# Patient Record
Sex: Female | Born: 1990 | Race: White | Hispanic: No | Marital: Single | Smoking: Never smoker
Health system: Southern US, Community
[De-identification: ages and names within clinical notes are randomized; demographics above are authoritative.]

## PROBLEM LIST (undated history)

## (undated) DIAGNOSIS — Z789 Other specified health status: Secondary | ICD-10-CM

## (undated) HISTORY — DX: Other specified health status: Z78.9

---
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
## 2013-04-17 ENCOUNTER — Other Ambulatory Visit (HOSPITAL_COMMUNITY): Payer: Self-pay | Admitting: Pulmonary Disease

## 2013-04-17 ENCOUNTER — Ambulatory Visit (HOSPITAL_COMMUNITY)
Admission: RE | Admit: 2013-04-17 | Discharge: 2013-04-17 | Disposition: A | Discharge status: Home / Self Care | Payer: BC Managed Care – PPO | Source: Ambulatory Visit | Attending: Pulmonary Disease | Admitting: Pulmonary Disease

## 2013-04-17 DIAGNOSIS — J029 Acute pharyngitis, unspecified: Secondary | ICD-10-CM | POA: Insufficient documentation

## 2013-04-17 DIAGNOSIS — R05 Cough: Secondary | ICD-10-CM

## 2013-04-17 DIAGNOSIS — R0989 Other specified symptoms and signs involving the circulatory and respiratory systems: Secondary | ICD-10-CM | POA: Insufficient documentation

## 2013-04-17 DIAGNOSIS — R059 Cough, unspecified: Secondary | ICD-10-CM | POA: Insufficient documentation

## 2013-04-30 ENCOUNTER — Other Ambulatory Visit (HOSPITAL_COMMUNITY): Payer: Self-pay | Admitting: Pulmonary Disease

## 2013-04-30 ENCOUNTER — Ambulatory Visit (HOSPITAL_COMMUNITY)
Admission: RE | Admit: 2013-04-30 | Discharge: 2013-04-30 | Disposition: A | Discharge status: Home / Self Care | Payer: BC Managed Care – PPO | Source: Ambulatory Visit | Attending: Pulmonary Disease | Admitting: Pulmonary Disease

## 2013-04-30 DIAGNOSIS — R52 Pain, unspecified: Secondary | ICD-10-CM

## 2013-04-30 DIAGNOSIS — R079 Chest pain, unspecified: Secondary | ICD-10-CM | POA: Insufficient documentation

## 2019-01-23 DIAGNOSIS — J029 Acute pharyngitis, unspecified: Secondary | ICD-10-CM | POA: Diagnosis not present

## 2019-01-23 DIAGNOSIS — J111 Influenza due to unidentified influenza virus with other respiratory manifestations: Secondary | ICD-10-CM | POA: Diagnosis not present

## 2019-01-23 DIAGNOSIS — R509 Fever, unspecified: Secondary | ICD-10-CM | POA: Diagnosis not present

## 2019-01-23 DIAGNOSIS — R000 Tachycardia, unspecified: Secondary | ICD-10-CM | POA: Diagnosis not present

## 2019-02-20 ENCOUNTER — Ambulatory Visit (HOSPITAL_COMMUNITY)
Admission: RE | Admit: 2019-02-20 | Discharge: 2019-02-20 | Disposition: A | Discharge status: Home / Self Care | Payer: BLUE CROSS/BLUE SHIELD | Source: Ambulatory Visit | Attending: Pulmonary Disease | Admitting: Pulmonary Disease

## 2019-02-20 ENCOUNTER — Other Ambulatory Visit (HOSPITAL_COMMUNITY): Payer: Self-pay | Admitting: Pulmonary Disease

## 2019-02-20 DIAGNOSIS — N200 Calculus of kidney: Secondary | ICD-10-CM

## 2019-02-21 ENCOUNTER — Encounter (HOSPITAL_COMMUNITY): Payer: Self-pay

## 2019-02-21 ENCOUNTER — Ambulatory Visit (HOSPITAL_COMMUNITY)
Admission: EM | Admit: 2019-02-21 | Discharge: 2019-02-21 | Disposition: A | Discharge status: Home / Self Care | Payer: BLUE CROSS/BLUE SHIELD | Attending: Family Medicine | Admitting: Family Medicine

## 2019-02-21 DIAGNOSIS — S39012A Strain of muscle, fascia and tendon of lower back, initial encounter: Secondary | ICD-10-CM | POA: Diagnosis not present

## 2019-02-21 DIAGNOSIS — S46812A Strain of other muscles, fascia and tendons at shoulder and upper arm level, left arm, initial encounter: Secondary | ICD-10-CM | POA: Diagnosis not present

## 2019-02-21 LAB — GLUCOSE, CAPILLARY: GLUCOSE-CAPILLARY: 96 mg/dL (ref 70–99)

## 2019-02-21 MED ORDER — NAPROXEN 375 MG PO TABS: 375.0000 mg | ORAL_TABLET | Freq: Two times a day (BID) | ORAL | 0 refills | Status: DC

## 2019-02-21 MED ORDER — CYCLOBENZAPRINE HCL 5 MG PO TABS: 5.0000 mg | ORAL_TABLET | Freq: Every day | ORAL | 0 refills | Status: DC

## 2019-02-21 NOTE — ED Provider Notes (Signed)
[place]   [id number] 02/21/19 Arrival Time: 6144  CC: back pain  SUBJECTIVE: History from: patient. Carol Soto is a 28 y.o. female complains of left low back pain that began 1 months ago, with worsening symptoms in the past 3-4 days.  Denies a precipitating event or specific injury.  Does mention having horses, and lifting hay.  Localizes the pain to the left low back.  Describes the pain as worsening, and more constant.  Was seen by PCP for similar symptoms and had a CT of her abdomen.  PCP concerned for kidney stones.  CT was negative.  Symptoms are made worse with laying down.  Denies similar symptoms in the past.  Does mentions associated left arm and foot tingling, as well as increased thirst.  Denies fever, chills, nausea, vomiting erythema, ecchymosis, effusion, weakness, abdominal pain, changes in bowel or bladder habits, numbness.    ROS: As per HPI.  History reviewed. No pertinent past medical history. History reviewed. No pertinent surgical history. Allergies  Allergen Reactions  . Amoxicillin    No current facility-administered medications on file prior to encounter.    No current outpatient medications on file prior to encounter.   Social History   Socioeconomic History  . Marital status: Single    Spouse name: Not on file  . Number of children: Not on file  . Years of education: Not on file  . Highest education level: Not on file  Occupational History  . Not on file  Social Needs  . Financial resource strain: Not on file  . Food insecurity:    Worry: Not on file    Inability: Not on file  . Transportation needs:    Medical: Not on file    Non-medical: Not on file  Tobacco Use  . Smoking status: Never Smoker  . Smokeless tobacco: Never Used  Substance and Sexual Activity  . Alcohol use: Not on file  . Drug use: Not on file  . Sexual activity: Not on file  Lifestyle  . Physical activity:    Days per week: Not on file    Minutes per  session: Not on file  . Stress: Not on file  Relationships  . Social connections:    Talks on phone: Not on file    Gets together: Not on file    Attends religious service: Not on file    Active member of club or organization: Not on file    Attends meetings of clubs or organizations: Not on file    Relationship status: Not on file  . Intimate partner violence:    Fear of current or ex partner: Not on file    Emotionally abused: Not on file    Physically abused: Not on file    Forced sexual activity: Not on file  Other Topics Concern  . Not on file  Social History Narrative  . Not on file   Family History  Problem Relation Age of Onset  . Healthy Mother   . Healthy Father     OBJECTIVE:  Vitals:   02/21/19 1708  BP: 129/89  Pulse: (!) 102  Resp: 20  Temp: 98.3 F (36.8 C)  TempSrc: Oral  SpO2: 98%    General appearance: Alert; in no acute distress.  Head: NCAT Lungs: CTA bilaterally Heart: RRR  Radial pulses 2+ bilaterally. Musculoskeletal: neck and back Inspection: Skin warm, dry, clear and intact without obvious erythema, effusion, or ecchymosis.  Palpation: Mildly TTP over LL paravertebral muscles,  and left superior trapezius ROM: FROM active and passive Strength: 5/5 shld abduction, 5/5 shld adduction, 5/5 elbow flexion, 5/5 elbow extension, 5/5 grip strength, 5/5 hip flexion, 5/5 knee abduction, 5/5 knee adduction, 5/5 knee flexion, 5/5 knee extension, 5/5 dorsiflexion, 5/5 plantar flexion Skin: warm and dry Neurologic: Ambulates without difficulty; sensation changes over posterior left upper arm, and left foot Psychological: alert and cooperative; normal mood and affect  LABS:  Results for orders placed or performed during the hospital encounter of 02/21/19 (from the past 24 hour(s))  Glucose, capillary     Status: None   Collection Time: 02/21/19  5:57 PM  Result Value Ref Range   Glucose-Capillary 96 70 - 99 mg/dL     ASSESSMENT & PLAN:  1. Strain  of muscle, fascia and tendon of lower back, initial encounter   2. Strain of left trapezius muscle, initial encounter     Meds ordered this encounter  Medications  . naproxen (NAPROSYN) 375 MG tablet    Sig: Take 1 tablet (375 mg total) by mouth 2 (two) times daily.    Dispense:  20 tablet    Refill:  0    Order Specific Question:   Supervising Provider    Answer:   Raylene Everts [[id number]]  . cyclobenzaprine (FLEXERIL) 5 MG tablet    Sig: Take 1 tablet (5 mg total) by mouth at bedtime.    Dispense:  12 tablet    Refill:  0    Order Specific Question:   Supervising Provider    Answer:   Raylene Everts [[id number]]   Blood sugar was 96 which is within normal limits Continue conservative management of rest, ice, heat and gentle stretches Take naproxen as needed for pain relief (may cause abdominal discomfort, ulcers, and GI bleeds avoid taking with other NSAIDs) Take cyclobenzaprine at nighttime for symptomatic relief. Avoid driving or operating heavy machinery while using medication. Follow up with PCP for further evaluation and management of symptoms Return or go to the ER if you have any new or worsening symptoms (fever, chills, chest pain, abdominal pain, changes in bowel or bladder habits, worsening numbness or tingling in extremities, etc...)   Reviewed expectations re: course of current medical issues. Questions answered. Outlined signs and symptoms indicating need for more acute intervention. Patient verbalized understanding. After Visit Summary given.    Lestine Box, PA-C 02/21/19 1950

## 2019-02-21 NOTE — Discharge Instructions (Signed)
Blood sugar was 96 which is within normal limits Continue conservative management of rest, ice, heat and gentle stretches Take naproxen as needed for pain relief (may cause abdominal discomfort, ulcers, and GI bleeds avoid taking with other NSAIDs) Take cyclobenzaprine at nighttime for symptomatic relief. Avoid driving or operating heavy machinery while using medication. Follow up with PCP for further evaluation and management of symptoms Return or go to the ER if you have any new or worsening symptoms (fever, chills, chest pain, abdominal pain, changes in bowel or bladder habits, worsening numbness or tingling in extremities, etc...)

## 2019-02-21 NOTE — ED Triage Notes (Signed)
Pt presents with lower back pain not associated with any injury or particular event that leaves her leg and arm tingling on the right side.  Pt has been to primary to be checked and has had urine and a CT scan do with all normal findings.

## 2019-02-26 ENCOUNTER — Ambulatory Visit (HOSPITAL_COMMUNITY): Payer: BLUE CROSS/BLUE SHIELD

## 2019-02-28 ENCOUNTER — Other Ambulatory Visit: Payer: Self-pay | Admitting: Pulmonary Disease

## 2019-02-28 ENCOUNTER — Other Ambulatory Visit (HOSPITAL_COMMUNITY): Payer: Self-pay | Admitting: Pulmonary Disease

## 2019-02-28 DIAGNOSIS — M545 Low back pain, unspecified: Secondary | ICD-10-CM

## 2019-03-06 ENCOUNTER — Other Ambulatory Visit: Payer: Self-pay

## 2019-03-06 ENCOUNTER — Ambulatory Visit (HOSPITAL_COMMUNITY)
Admission: RE | Admit: 2019-03-06 | Discharge: 2019-03-06 | Disposition: A | Discharge status: Home / Self Care | Payer: BLUE CROSS/BLUE SHIELD | Source: Ambulatory Visit | Attending: Pulmonary Disease | Admitting: Pulmonary Disease

## 2019-03-06 DIAGNOSIS — M545 Low back pain, unspecified: Secondary | ICD-10-CM

## 2019-04-03 DIAGNOSIS — N6320 Unspecified lump in the left breast, unspecified quadrant: Secondary | ICD-10-CM | POA: Diagnosis not present

## 2019-06-05 DIAGNOSIS — Z020 Encounter for examination for admission to educational institution: Secondary | ICD-10-CM | POA: Diagnosis not present

## 2019-06-05 DIAGNOSIS — R5383 Other fatigue: Secondary | ICD-10-CM | POA: Diagnosis not present

## 2019-06-05 DIAGNOSIS — Z1389 Encounter for screening for other disorder: Secondary | ICD-10-CM | POA: Diagnosis not present

## 2019-06-05 DIAGNOSIS — Z0000 Encounter for general adult medical examination without abnormal findings: Secondary | ICD-10-CM | POA: Diagnosis not present

## 2019-06-05 DIAGNOSIS — E7800 Pure hypercholesterolemia, unspecified: Secondary | ICD-10-CM | POA: Diagnosis not present

## 2019-06-05 DIAGNOSIS — E559 Vitamin D deficiency, unspecified: Secondary | ICD-10-CM | POA: Diagnosis not present

## 2019-06-05 DIAGNOSIS — E039 Hypothyroidism, unspecified: Secondary | ICD-10-CM | POA: Diagnosis not present

## 2019-09-20 ENCOUNTER — Ambulatory Visit: Payer: BLUE CROSS/BLUE SHIELD | Admitting: Cardiology

## 2019-09-20 ENCOUNTER — Encounter: Payer: Self-pay | Admitting: *Deleted

## 2019-09-21 ENCOUNTER — Ambulatory Visit: Payer: BC Managed Care – PPO | Admitting: Cardiology

## 2019-09-21 ENCOUNTER — Encounter: Payer: Self-pay | Admitting: Cardiology

## 2019-09-21 ENCOUNTER — Telehealth: Payer: Self-pay | Admitting: Cardiology

## 2019-09-21 ENCOUNTER — Other Ambulatory Visit: Payer: Self-pay

## 2019-09-21 VITALS — BP 130/82 | HR 72 | Ht 65.0 in | Wt 109.0 lb

## 2019-09-21 DIAGNOSIS — R002 Palpitations: Secondary | ICD-10-CM

## 2019-09-21 DIAGNOSIS — I493 Ventricular premature depolarization: Secondary | ICD-10-CM

## 2019-09-21 NOTE — Telephone Encounter (Signed)
°  Precert needed for:  48 hour holter - Zio patch - PVC

## 2019-09-21 NOTE — Patient Instructions (Signed)
Medication Instructions:  Continue all current medications.  Labwork: none  Testing/Procedures:  Your physician has recommended that you wear a 48 hour holter monitor. Holter monitors are medical devices that record the heart's electrical activity. Doctors most often use these monitors to diagnose arrhythmias. Arrhythmias are problems with the speed or rhythm of the heartbeat. The monitor is a small, portable device. You can wear one while you do your normal daily activities. This is usually used to diagnose what is causing palpitations/syncope (passing out).  Office will contact with results via phone or letter.    Follow-Up: To be determined   Any Other Special Instructions Will Be Listed Below (If Applicable).  If you need a refill on your cardiac medications before your next appointment, please call your pharmacy.

## 2019-09-21 NOTE — Progress Notes (Signed)
Cardiology Office Note  Date: 09/21/2019   ID: Carol Soto, DOB 02-17-1991, MRN [id number]  PCP:  Sinda Du, MD  Cardiologist: Satira Sark, MD Electrophysiologist:  None   Chief Complaint  Patient presents with  . Palpitations    History of Present Illness: Carol Soto is a 28 y.o. female referred for cardiology consultation by Ms. Lavena Bullion, NP with [facility] in [place] for the evaluation of palpitations.  Her PCP is Dr. Luan Pulling.  Her grandfather is a patient of mine.  She presents reporting intermittent episodes of palpitations, describes a forceful heartbeat but not specifically rapid heart rate, no dizziness or syncope. These have generally occurred after her menstrual period over the last few months.  She had an episode in the past after taking Zyrtec.  She states that she started her period on August 3 and experienced palpitations on August 13 that lasted over a week.  Next period was on August 31 followed by palpitations on September 9 for a few days.  Next period was September 21 which was early and associated with minor chest discomfort and then palpitations on the 26th.  She is scheduled to be evaluated by a gynecologist due to changing menstrual cycle.  She has cut back caffeine as well.  I personally reviewed a copy of the ECG obtained recently in September which showed sinus rhythm with PVCs, positive in the inferior leads suggesting outflow tract origin, otherwise decreased R wave progression and normal intervals.  She had an echocardiogram obtained in September in [place] that reported normal LV chamber size and contraction with mild mitral regurgitation.  I personally reviewed her ECG today which shows a sinus rhythm with probable lead motion artifact rather than PACs, normal intervals.  She otherwise feels well when she is active, no exertional symptomatology.  She is currently Training and development officer.  Past Medical  History:  Diagnosis Date  . No pertinent past medical history     Past Surgical History:  Procedure Laterality Date  . No prior surgeries      No current outpatient medications on file.   No current facility-administered medications for this visit.    Allergies:  Amoxicillin and Penicillins   Social History: The patient  reports that she has never smoked. She has never used smokeless tobacco. She reports that she does not drink alcohol or use drugs.   Family History: The patient's family history includes Healthy in her father and mother; Heart attack in her paternal grandmother; Heart disease in her paternal grandmother.   ROS:  Please see the history of present illness. Otherwise, complete review of systems is positive for none.  All other systems are reviewed and negative.   Physical Exam: VS:  BP 130/82   Pulse 72   Ht 5\' 5"  (1.651 m)   Wt 109 lb (49.4 kg)   SpO2 98%   BMI 18.14 kg/m , BMI Body mass index is 18.14 kg/m.  Wt Readings from Last 3 Encounters:  09/21/19 109 lb (49.4 kg)    General: Slender young woman, appears comfortable at rest. HEENT: Conjunctiva and lids normal, wearing a mask. Neck: Supple, no elevated JVP or carotid bruits, no thyromegaly. Lungs: Clear to auscultation, nonlabored breathing at rest. Cardiac: Regular rate and rhythm, no S3 or significant systolic murmur, no pericardial rub. Abdomen: Soft, nontender, bowel sounds present. Extremities: No pitting edema, distal pulses 2+. Skin: Warm and dry. Musculoskeletal: No kyphosis. Neuropsychiatric: Alert and oriented x3, affect grossly appropriate.  ECG:  An ECG dated September 2020 was personally reviewed today and demonstrated:  Sinus rhythm with PVCs, decreased R wave progression.  Recent Labwork:  September 2020: Hemoglobin 13.5, platelets 228, BUN 15, creatinine 0.75, potassium 4.1, AST 15, ALT 10, TSH 2.23  Assessment and Plan:  Intermittent palpitations as discussed above with  documented PVCs by ECG provided. They look to be coming from the outflow tract which suggests a benign etiology.  It could be that these are associated with her menstrual cycle, recent TSH normal and LVEF normal by echocardiogram.  She has cut back caffeine as well.  Plan is to try and quantify PVC burden with next episode, ZIO patch will be provided for 24 to 48 hours.  Do not anticipate standing medical therapy at this point.  Medication Adjustments/Labs and Tests Ordered: Current medicines are reviewed at length with the patient today.  Concerns regarding medicines are outlined above.   Tests Ordered: Orders Placed This Encounter  Procedures  . HOLTER MONITOR - 48 HOUR  . EKG 12-Lead    Medication Changes: No orders of the defined types were placed in this encounter.   Disposition:  Follow up test results and determine follow-up plan.  Signed, Satira Sark, MD, [facility] 09/21/2019 4:42 PM    [facility], [place], [place] [address] Phone: [phone]; Fax: [phone]

## 2019-10-01 ENCOUNTER — Encounter (INDEPENDENT_AMBULATORY_CARE_PROVIDER_SITE_OTHER): Payer: BC Managed Care – PPO

## 2019-10-01 DIAGNOSIS — I493 Ventricular premature depolarization: Secondary | ICD-10-CM

## 2019-10-16 ENCOUNTER — Telehealth: Payer: Self-pay | Admitting: *Deleted

## 2019-10-16 NOTE — Telephone Encounter (Signed)
Patient informed. Copy sent to PCP °

## 2019-10-16 NOTE — Telephone Encounter (Signed)
-----   Message from Satira Sark, MD sent at 10/16/2019  2:10 PM EDT ----- Results reviewed.  Overall reassuring, there were only rare PVCs noted representing less than 1% of total beats.  Would not anticipate starting any specific medications at this time, would continue with observation.  We can see her back in about 6 months for review.

## 2020-04-16 ENCOUNTER — Telehealth: Payer: Self-pay

## 2020-04-16 NOTE — Telephone Encounter (Signed)
  Patient Consent for Virtual Visit         Carol Soto has provided verbal consent on 04/16/2020 for a virtual visit (video or telephone).   CONSENT FOR VIRTUAL VISIT FOR:  Carol Soto  By participating in this virtual visit I agree to the following:  I hereby voluntarily request, consent and authorize [facility] and its employed or contracted physicians, physician assistants, nurse practitioners or other licensed health care professionals (the Practitioner), to provide me with telemedicine health care services (the "Services") as deemed necessary by the treating Practitioner. I acknowledge and consent to receive the Services by the Practitioner via telemedicine. I understand that the telemedicine visit will involve communicating with the Practitioner through live audiovisual communication technology and the disclosure of certain medical information by electronic transmission. I acknowledge that I have been given the opportunity to request an in-person assessment or other available alternative prior to the telemedicine visit and am voluntarily participating in the telemedicine visit.  I understand that I have the right to withhold or withdraw my consent to the use of telemedicine in the course of my care at any time, without affecting my right to future care or treatment, and that the Practitioner or I may terminate the telemedicine visit at any time. I understand that I have the right to inspect all information obtained and/or recorded in the course of the telemedicine visit and may receive copies of available information for a reasonable fee.  I understand that some of the potential risks of receiving the Services via telemedicine include:  Marland Kitchen Delay or interruption in medical evaluation due to technological equipment failure or disruption; . Information transmitted may not be sufficient (e.g. poor resolution of images) to allow for appropriate medical decision making by the  Practitioner; and/or  . In rare instances, security protocols could fail, causing a breach of personal health information.  Furthermore, I acknowledge that it is my responsibility to provide information about my medical history, conditions and care that is complete and accurate to the best of my ability. I acknowledge that Practitioner's advice, recommendations, and/or decision may be based on factors not within their control, such as incomplete or inaccurate data provided by me or distortions of diagnostic images or specimens that may result from electronic transmissions. I understand that the practice of medicine is not an exact science and that Practitioner makes no warranties or guarantees regarding treatment outcomes. I acknowledge that a copy of this consent can be made available to me via my patient portal ([facility]), or I can request a printed copy by calling the office of [facility].    I understand that my insurance will be billed for this visit.   I have read or had this consent read to me. . I understand the contents of this consent, which adequately explains the benefits and risks of the Services being provided via telemedicine.  . I have been provided ample opportunity to ask questions regarding this consent and the Services and have had my questions answered to my satisfaction. . I give my informed consent for the services to be provided through the use of telemedicine in my medical care

## 2020-04-17 ENCOUNTER — Telehealth (INDEPENDENT_AMBULATORY_CARE_PROVIDER_SITE_OTHER): Payer: 59 | Admitting: Cardiology

## 2020-04-17 ENCOUNTER — Encounter: Payer: Self-pay | Admitting: Cardiology

## 2020-04-17 VITALS — BP 121/82 | HR 78 | Ht 64.0 in | Wt 111.0 lb

## 2020-04-17 DIAGNOSIS — I493 Ventricular premature depolarization: Secondary | ICD-10-CM

## 2020-04-17 NOTE — Progress Notes (Signed)
Virtual Visit via Telephone Note   This visit type was conducted due to national recommendations for restrictions regarding the COVID-19 Pandemic (e.g. social distancing) in an effort to limit this patient's exposure and mitigate transmission in our community.  Due to her co-morbid illnesses, this patient is at least at moderate risk for complications without adequate follow up.  This format is felt to be most appropriate for this patient at this time.  The patient did not have access to video technology/had technical difficulties with video requiring transitioning to audio format only (telephone).  All issues noted in this document were discussed and addressed.  No physical exam could be performed with this format.  Please refer to the patient's chart for her  consent to telehealth for [facility].   The patient was identified using 2 identifiers.  Date:  04/17/2020   ID:  Carol Soto, DOB July 04, 1991, MRN [id number]  Patient Location: Home Provider Location: Home  PCP:  Sinda Du, MD  Cardiologist:  Rozann Lesches, MD Electrophysiologist:  None   Evaluation Performed:  Follow-Up Visit  Chief Complaint:  Cardiac follow-up  History of Present Illness:    Carol Soto is a 29 y.o. female seen in consultation back in October 2020.  We spoke by phone today.  She tells me that she has not had any significant palpitations in the last few months, her symptoms basically went away about a month after our original consultation.  She has since graduated and is now working full-time as a Gaffer at [employer].  48-hour cardiac monitor in October 2020 revealed only rare PVCs representing less than 1% of total beats.  These are suspected to be from the outflow tract and relatively benign based on her ECG.  She had an echocardiogram obtained in September 2020 in [place] that reported normal LV chamber size and contraction with mild mitral regurgitation.   Past Medical  History:  Diagnosis Date  . No pertinent past medical history    Past Surgical History:  Procedure Laterality Date  . No prior surgeries       No outpatient medications have been marked as taking for the 04/17/20 encounter (Telemedicine) with Satira Sark, MD.     Allergies:   Amoxicillin and Penicillins   ROS:   No syncope.  Prior CV studies:   The following studies were reviewed today:  Zio patch October 2020: 48-hour Zio patch reviewed.  Predominant rhythm is sinus.  Heart rate ranged from 54 bpm up to 170 bpm with average heart rate 85 bpm.  Rare PVCs were noted representing less than 1% of total beats (single PVCs, rare couplets, brief episode of trigeminy).  There were no sustained arrhythmias or pauses.  Labs/Other Tests and Data Reviewed:    EKG:  An ECG dated 09/21/2019 was personally reviewed today and demonstrated:  Sinus rhythm with lead motion artifact.  Recent Labs:  September 2020: Hemoglobin 13.5, platelets 228, BUN 15, creatinine 0.75, potassium 4.1, AST 15, ALT 10, TSH 2.23  Wt Readings from Last 3 Encounters:  04/17/20 111 lb (50.3 kg)  09/21/19 109 lb (49.4 kg)     Objective:    Vital Signs:  BP 121/82   Pulse 78   Ht 5\' 4"  (1.626 m)   Wt 111 lb (50.3 kg)   BMI 19.05 kg/m    Patient spoke in full sentences, not short of breath. No audible wheezing or coughing.  ASSESSMENT & PLAN:    History of palpitations with documented  PVCs that are likely from the outflow tract and benign.  LVEF normal by echocardiogram is also reassuring.  Cardiac monitor showed only rare PVCs representing less than 1% of total beats, no sustained arrhythmias.  Her symptoms have subsequently resolved.  Would recommend observation, no further cardiac testing or medications at this time.   Time:   Today, I have spent 5 minutes with the patient with telehealth technology discussing the above problems.     Medication Adjustments/Labs and Tests Ordered: Current  medicines are reviewed at length with the patient today.  Concerns regarding medicines are outlined above.   Tests Ordered: No orders of the defined types were placed in this encounter.   Medication Changes: No orders of the defined types were placed in this encounter.   Follow Up:  as needed.   Signed, Rozann Lesches, MD  04/17/2020 9:33 AM    [place]

## 2020-04-17 NOTE — Patient Instructions (Signed)
Medication Instructions: Your physician recommends that you continue on your current medications as directed. Please refer to the Current Medication list given to you today.   Labwork: None today  Procedures/Testing: None today  Follow-Up: As needed with Dr.McDowell  Any Additional Special Instructions Will Be Listed Below (If Applicable).     If you need a refill on your cardiac medications before your next appointment, please call your pharmacy.      Thank you for choosing [facility] !

## 2020-06-02 DIAGNOSIS — Z118 Encounter for screening for other infectious and parasitic diseases: Secondary | ICD-10-CM | POA: Diagnosis not present

## 2020-06-02 DIAGNOSIS — N926 Irregular menstruation, unspecified: Secondary | ICD-10-CM | POA: Diagnosis not present

## 2020-06-13 DIAGNOSIS — R1032 Left lower quadrant pain: Secondary | ICD-10-CM | POA: Diagnosis not present

## 2020-06-13 DIAGNOSIS — N926 Irregular menstruation, unspecified: Secondary | ICD-10-CM | POA: Diagnosis not present

## 2020-07-14 IMAGING — CT CT RENAL STONE PROTOCOL
2 of 4 series · 16 of 46 positions shown, 18 images · non-contrast
Comparison: None.

CLINICAL DATA: Nephrolithiasis.

EXAM:
CT ABDOMEN AND PELVIS WITHOUT CONTRAST
TECHNIQUE: Multidetector CT imaging of the abdomen and pelvis was performed
following the standard protocol without IV contrast.

[Series 2: axial st · axial · 0.60mm/px · z∈[+979,+1359]mm · 13 of 84 slices shown, 15 images]
[im 4/84  soft-tissue]
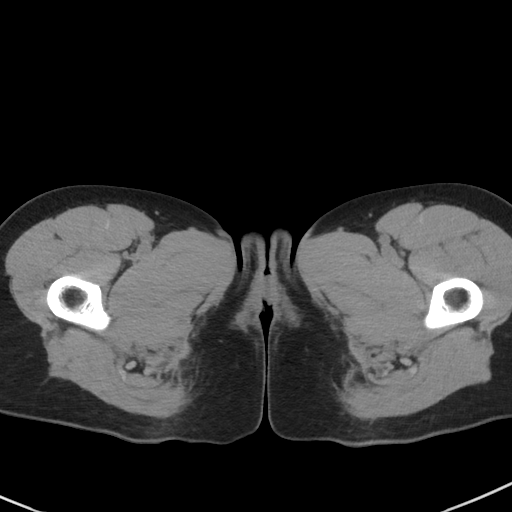
[im 4/84  bone]
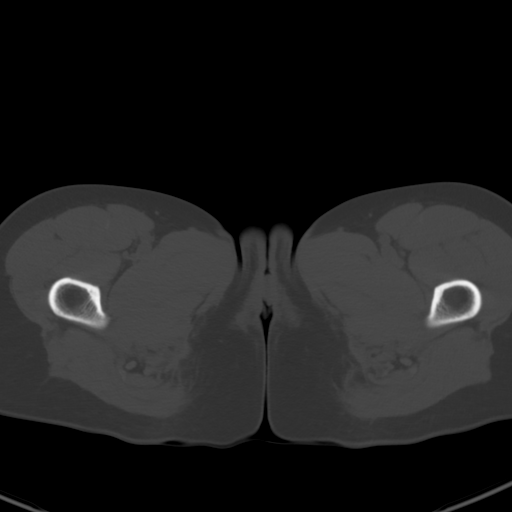
[im 10/84  soft-tissue]
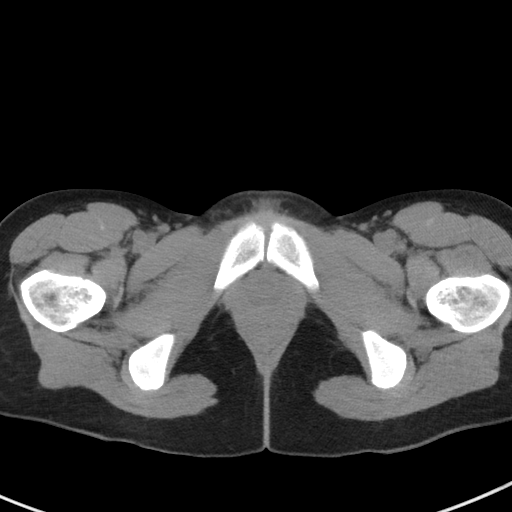
[im 17/84  soft-tissue]
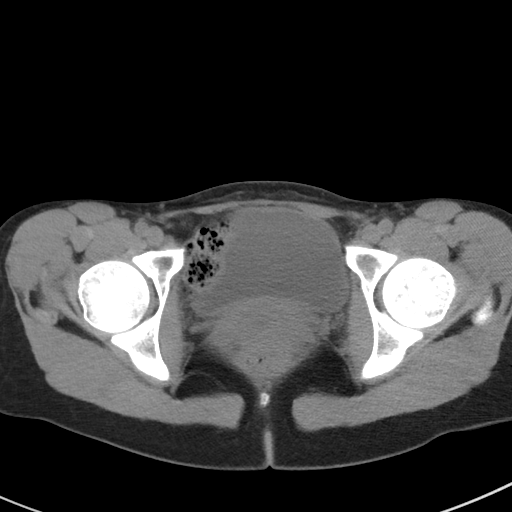
[im 24/84  soft-tissue]
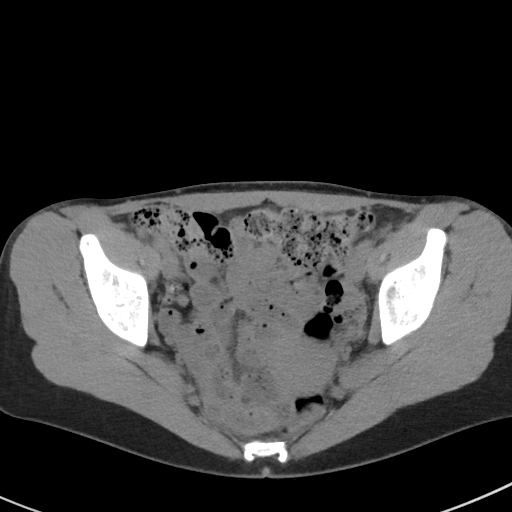
[im 30/84  soft-tissue]
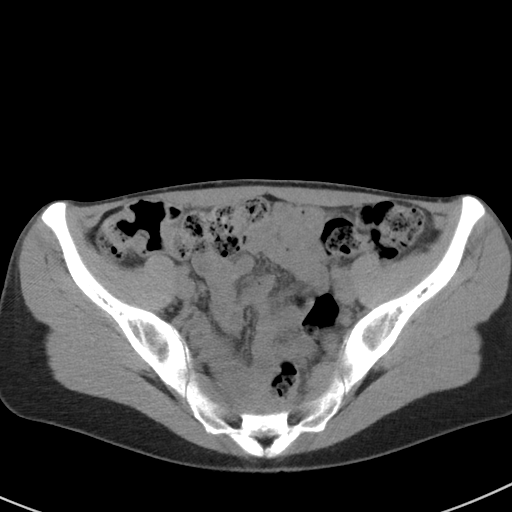
[im 37/84  soft-tissue]
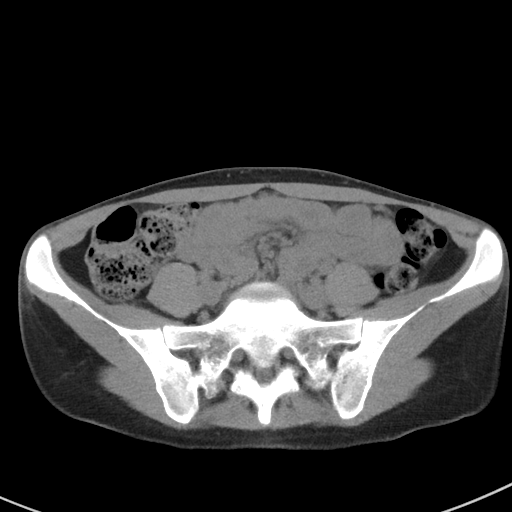
[im 44/84  soft-tissue]
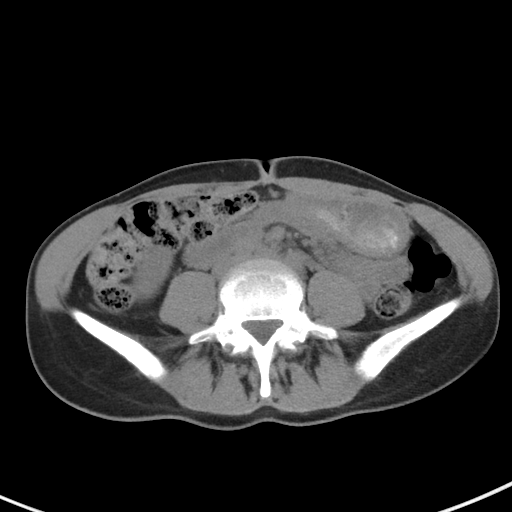
[im 47/84  soft-tissue]
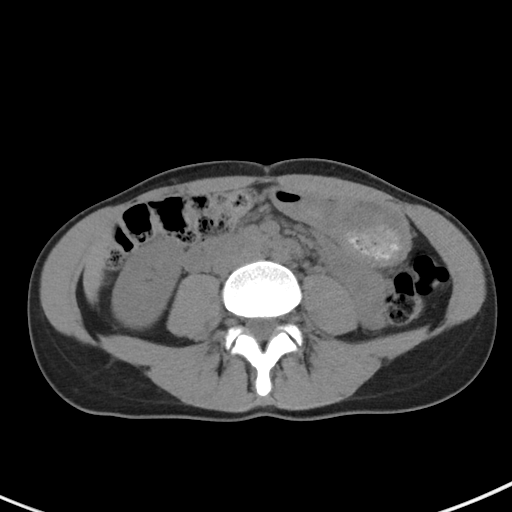
[im 54/84  soft-tissue]
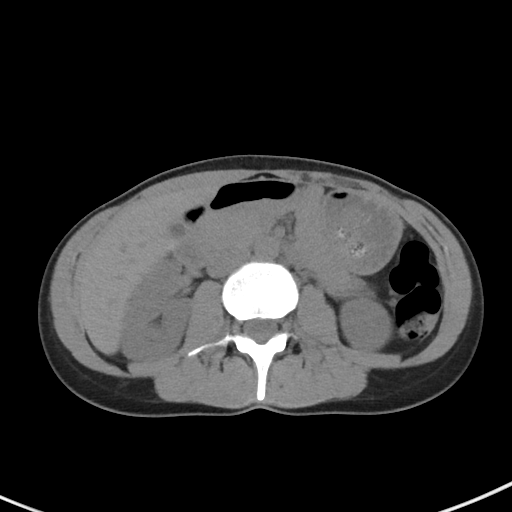
[im 54/84  bone]
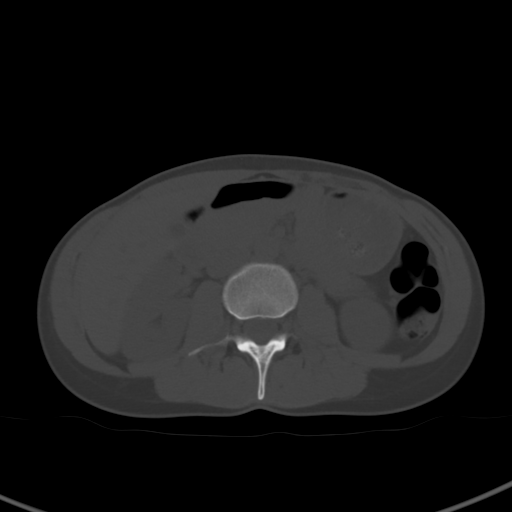
[im 60/84  soft-tissue]
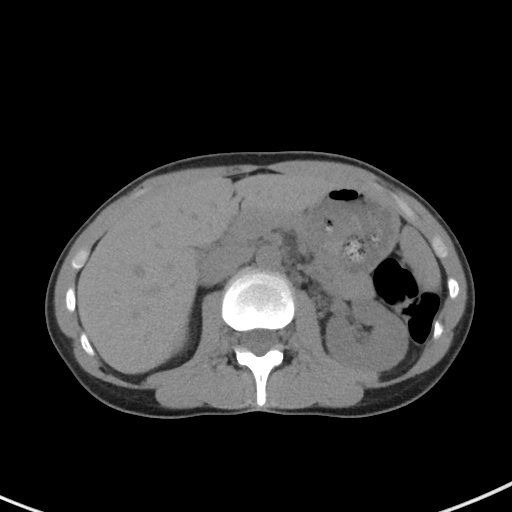
[im 67/84  soft-tissue]
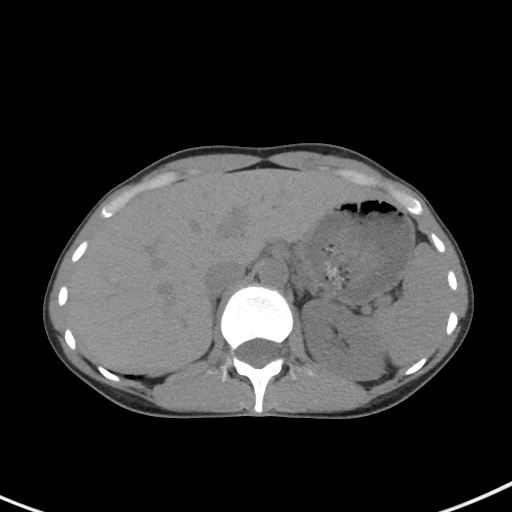
[im 74/84  soft-tissue]
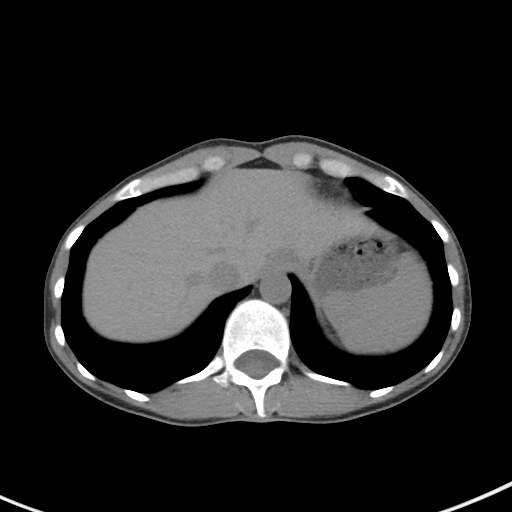
[im 80/84  soft-tissue]
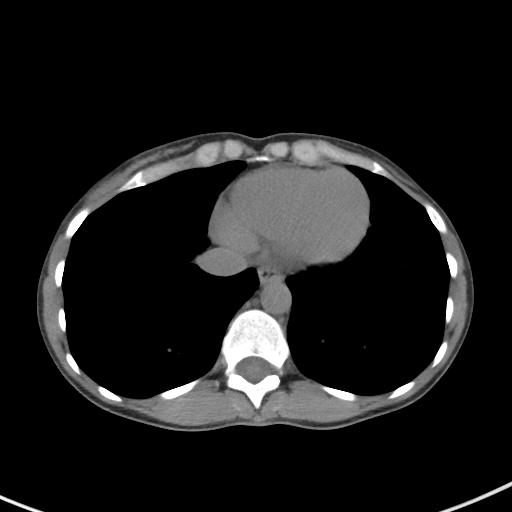

[Series 5: coronal st · coronal · 0.57mm/px · 3 of 58 slices shown]
[im 20/58  soft-tissue]
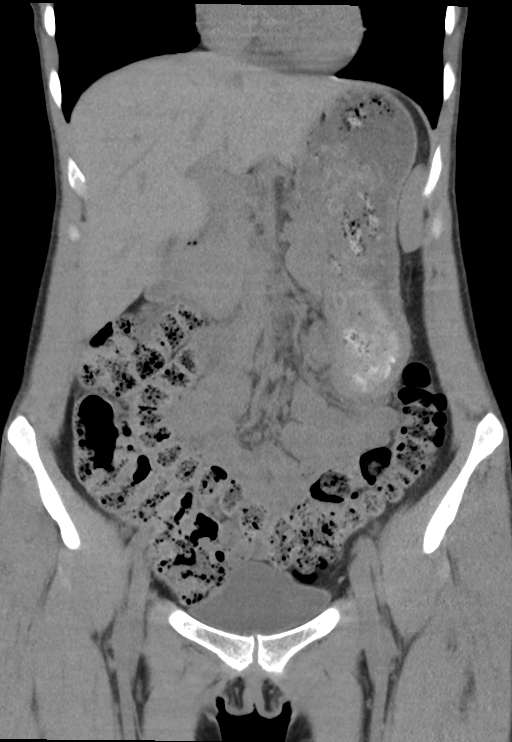
[im 26/58  soft-tissue]
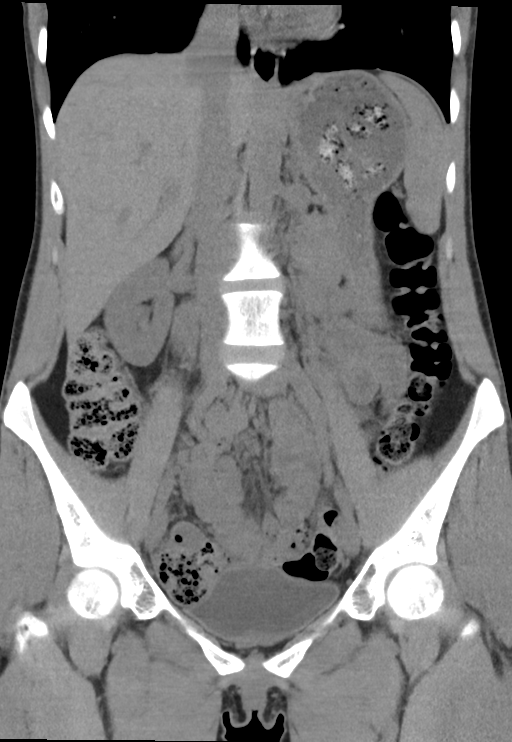
[im 32/58  soft-tissue]
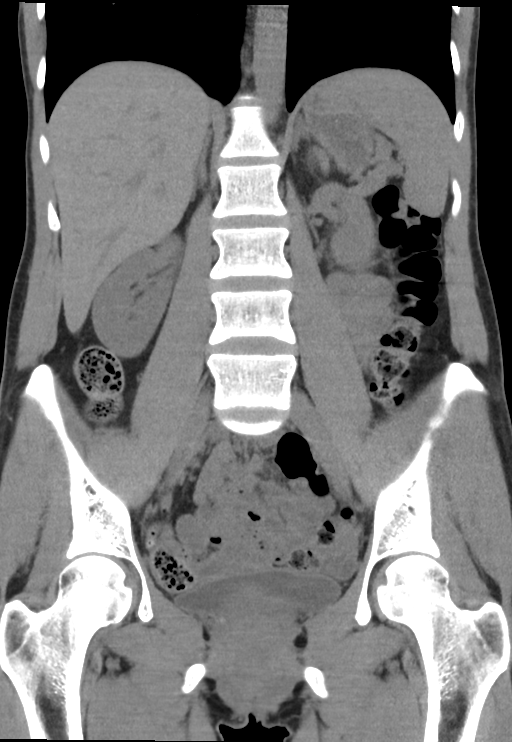

[16 of 46 positions shown; findings below may reference images not displayed]

FINDINGS: Lower chest: No acute findings.

Hepatobiliary:  No mass visualized on this unenhanced exam.

Pancreas: No mass or inflammatory process visualized on this
unenhanced exam.

Spleen:  Within normal limits in size.

Adrenals/Urinary tract: No evidence of nephrolithiasis or
hydronephrosis. No evidence of ureteral calculi or dilatation.
Unremarkable unopacified urinary bladder.

Stomach/Bowel: No evidence of obstruction, inflammatory process, or
abnormal fluid collections. Normal appendix visualized.

Vascular/Lymphatic: No pathologically enlarged lymph nodes
identified. No evidence of abdominal aortic aneurysm.

Reproductive:  No mass or other significant abnormality.

Other:  None.

Musculoskeletal:  No suspicious bone lesions identified.
IMPRESSION: No evidence of urolithiasis, hydronephrosis, or other acute
findings.

## 2020-10-21 DIAGNOSIS — D485 Neoplasm of uncertain behavior of skin: Secondary | ICD-10-CM | POA: Diagnosis not present

## 2020-10-21 DIAGNOSIS — D225 Melanocytic nevi of trunk: Secondary | ICD-10-CM | POA: Diagnosis not present

## 2020-10-21 DIAGNOSIS — D2261 Melanocytic nevi of right upper limb, including shoulder: Secondary | ICD-10-CM | POA: Diagnosis not present

## 2020-11-03 DIAGNOSIS — Z0189 Encounter for other specified special examinations: Secondary | ICD-10-CM | POA: Diagnosis not present

## 2020-11-05 DIAGNOSIS — Z01419 Encounter for gynecological examination (general) (routine) without abnormal findings: Secondary | ICD-10-CM | POA: Diagnosis not present

## 2020-11-05 DIAGNOSIS — Z6820 Body mass index (BMI) 20.0-20.9, adult: Secondary | ICD-10-CM | POA: Diagnosis not present

## 2020-11-06 DIAGNOSIS — Z0001 Encounter for general adult medical examination with abnormal findings: Secondary | ICD-10-CM | POA: Diagnosis not present

## 2021-03-20 DIAGNOSIS — N6320 Unspecified lump in the left breast, unspecified quadrant: Secondary | ICD-10-CM | POA: Diagnosis not present

## 2021-03-24 ENCOUNTER — Other Ambulatory Visit (HOSPITAL_COMMUNITY): Payer: Self-pay | Admitting: Obstetrics and Gynecology

## 2021-03-24 ENCOUNTER — Other Ambulatory Visit: Payer: Self-pay | Admitting: Obstetrics and Gynecology

## 2021-03-24 DIAGNOSIS — N63 Unspecified lump in unspecified breast: Secondary | ICD-10-CM

## 2021-03-24 DIAGNOSIS — N6320 Unspecified lump in the left breast, unspecified quadrant: Secondary | ICD-10-CM

## 2021-03-26 ENCOUNTER — Ambulatory Visit (HOSPITAL_COMMUNITY)
Admission: RE | Admit: 2021-03-26 | Discharge: 2021-03-26 | Disposition: A | Discharge status: Home / Self Care | Payer: 59 | Source: Ambulatory Visit | Attending: Obstetrics and Gynecology | Admitting: Obstetrics and Gynecology

## 2021-03-26 DIAGNOSIS — N63 Unspecified lump in unspecified breast: Secondary | ICD-10-CM | POA: Insufficient documentation

## 2021-03-26 DIAGNOSIS — R922 Inconclusive mammogram: Secondary | ICD-10-CM | POA: Diagnosis not present

## 2021-03-26 DIAGNOSIS — N6489 Other specified disorders of breast: Secondary | ICD-10-CM | POA: Diagnosis not present

## 2021-05-01 ENCOUNTER — Other Ambulatory Visit: Payer: 59
